# Patient Record
Sex: Female | Born: 1935 | Race: White | Hispanic: No | Smoking: Never smoker
Health system: Southern US, Community
[De-identification: ages and names within clinical notes are randomized; demographics above are authoritative.]

---

## 1999-02-21 ENCOUNTER — Encounter: Payer: Self-pay | Admitting: Emergency Medicine

## 1999-02-21 ENCOUNTER — Emergency Department (HOSPITAL_COMMUNITY): Admission: EM | Admit: 1999-02-21 | Discharge: 1999-02-21 | Payer: Self-pay | Admitting: Emergency Medicine

## 2003-08-01 ENCOUNTER — Encounter: Payer: Self-pay | Admitting: Internal Medicine

## 2003-08-01 ENCOUNTER — Encounter: Admission: RE | Admit: 2003-08-01 | Discharge: 2003-08-01 | Payer: Self-pay | Admitting: Internal Medicine

## 2003-09-25 ENCOUNTER — Encounter
Admission: RE | Admit: 2003-09-25 | Discharge: 2003-09-25 | Payer: Self-pay | Admitting: Physical Medicine and Rehabilitation

## 2004-06-07 ENCOUNTER — Inpatient Hospital Stay (HOSPITAL_COMMUNITY): Admission: RE | Admit: 2004-06-07 | Discharge: 2004-06-08 | Payer: Self-pay | Admitting: Neurosurgery

## 2005-04-14 ENCOUNTER — Inpatient Hospital Stay (HOSPITAL_COMMUNITY): Admission: EM | Admit: 2005-04-14 | Discharge: 2005-04-22 | Payer: Self-pay | Admitting: Emergency Medicine

## 2005-04-18 ENCOUNTER — Encounter (INDEPENDENT_AMBULATORY_CARE_PROVIDER_SITE_OTHER): Payer: Self-pay | Admitting: *Deleted

## 2010-03-29 ENCOUNTER — Encounter: Admission: RE | Admit: 2010-03-29 | Discharge: 2010-04-13 | Payer: Self-pay | Admitting: Internal Medicine

## 2011-04-08 NOTE — Discharge Summary (Signed)
NAMEKINISHA, SOPER NO.:  [id number]   MEDICAL RECORD NO.:  [id number]          PATIENT TYPE:  INP   LOCATION:  5711                         FACILITY:  [facility]   PHYSICIAN:  Gita Kudo, M.D. DATE OF BIRTH:  [birth year]   DATE OF ADMISSION:  04/13/2005  DATE OF DISCHARGE:  04/22/2005                                 DISCHARGE SUMMARY   DISCHARGE DIAGNOSES:  1.  Gallstone pancreatitis status post laparoscopic cholecystectomy with      intraoperative cholangiogram on Apr 18, 2005, by Dr. Avel Peace.  2.  Hypertension, treated.  3.  Hypothyroidism, treated.  4.  Arthritis.  5.  Systolic murmur.  6.  History of back surgery in July 2005.  7.  Status post tonsillectomy in the past.  8.  Allergy to SULFA.   Ms. Haughton is an 77 year old female who complains of six-week history of  nausea accompanied by poor appetite and fatigue.  These symptoms worsened  and gradually became accompanied by fever.  She presented to the emergency  room and gallbladder ultrasound showed a cholelithiasis with gallbladder  wall thickening as well as the normal common bile duct.  Her amylase and  lipase were elevated and her white count was 35,000.  She was admitted with  biliary pancreatitis.  Ultimately, her amylase and lipase normalized and by  Apr 18, 2005, she was taken to the operating room by Dr. Avel Peace.  She underwent a laparoscopic cholecystectomy with intraoperative  cholangiogram.  Patient tolerated the procedure well.  Over the next three  days, her activity was increased and her diet was advanced.  She was  discharged to home in stable condition.  Her JP drain was pulled prior to  her discharge.   Please note that during the patient's hospitalization, she did undergo ERCP  with sphincterotomy.  The common bile duct showed no obvious stones.  A  small sphincterotomy was done with good drainage.  This was performed prior  to her laparoscopic  cholecystectomy.   The patient was discharged home in stable condition.  She was to continue  her Synthroid, hydrochlorothiazide and aspirin daily.  She is to use Tylenol  as needed for pain.  She is not to lift over 10 pounds.  She is to drink  lots of fluids.  She may shower.  She is to follow up with Dr. Abbey Chatters in  two to three weeks and she is to call to make this appointment.        LB/MEDQ  D:  06/23/2005  T:  06/24/2005  Job:  [id number]   cc:   Theressa Millard, M.D.  [address] [place]  [place] [address]  Fax: [phone]

## 2011-04-08 NOTE — Discharge Summary (Signed)
[name] Edwards, [name] Edwards                 ACCOUNT NO.:  [id number]   MEDICAL RECORD NO.:  [id number]          PATIENT TYPE:  INP   LOCATION:  5711                         FACILITY:  [facility]   PHYSICIAN:  Adolph Pollack, M.D.DATE OF BIRTH:  Jan 21, 1916   DATE OF ADMISSION:  04/13/2005  DATE OF DISCHARGE:  04/22/2005                                 DISCHARGE SUMMARY   PRINCIPAL DISCHARGE DIAGNOSIS:  Biliary pancreatitis.   SECONDARY DIAGNOSES:  1.  Hypertension.  2.  Hypothyroidism.  3.  Arthritis.   OPERATION:  Laparoscopic cholecystectomy with intraoperative cholangiogram,  Apr 18, 2005.   REASON FOR ADMISSION:  [name] is an 77 year old female, admitted by Dr.  Merlene Laughter, Apr 13, 2005, when she had been having some abdominal pain  and nausea and vomiting with a fever.  She is brought to the emergency  department.  He evaluated her and noted that she had findings consistent  with biliary pancreatitis and admitted her (for more details please refer to  his admission history and physical).   HOSPITAL COURSE:  She is admitted, placed on empiric antibiotics, bowel  rest, and hydrated.  Surgical consultation was obtained, Apr 14, 2005.  She  had a persistent elevation of her bilirubin and a GI consultation was  obtained, and she was taken for an ERCP, where a periampullary diverticulum  was noted but no obvious common bile duct stones.  She recovered from that  quite well and then underwent a laparoscopic cholecystectomy, Apr 18, 2005,  which she tolerated fairly well.  She had a slow recovery following that.  A  19 Blake  drain was left.  By her third postoperative day, however, she was  more comfortable.  She was out of bed.  She was eating better.  Her drain  was removed and she was discharged.   DISPOSITION:  Discharged to home in satisfactory condition on April 22, 2005.  She was given discharge instructions.   PLAN:  Will be for her to call for an appointment to follow up in  the office  in 2-3 weeks.      Adolph Pollack, M.D.  Electronically Signed     TJR/MEDQ  D:  07/06/2005  T:  07/06/2005  Job:  [id number]   cc:   Theressa Millard, M.D.  [address] [place]  [place] [address]  Fax: [phone]   Graylin Shiver, M.D.  [address]  [address]  [place], [place] [address]  Fax: [phone]

## 2011-04-08 NOTE — Consult Note (Signed)
[name] Edwards, [name] Edwards                 ACCOUNT NO.:  [id number]   MEDICAL RECORD NO.:  [id number]          PATIENT TYPE:  INP   LOCATION:  5152                         FACILITY:  [facility]   PHYSICIAN:  Adolph Pollack, M.D.DATE OF BIRTH:  1916/09/27   DATE OF CONSULTATION:  04/14/2005  DATE OF DISCHARGE:                                   CONSULTATION   REQUESTING PHYSICIAN:  Theressa Millard, M.D.   REASON FOR CONSULTATION:  Biliary pancreatitis.   HISTORY OF PRESENT ILLNESS:  [name] Edwards is an 77 year old female who has had  about a six-week history of just having a poor appetite, having nausea and  fatigue.  This subsequently worsened the day of admission, and she is noted  to have fever.  She came to the emergency department and was evaluated.  At  that time the evaluation included an abdominal ultrasound demonstrating some  cholelithiasis, a thickened gallbladder wall, what appeared to be a normal-  sized common bile duct.  However, her liver functions were elevated, white  cell count was approximately 35,000, and amylase and lipase were elevated  consistent with biliary pancreatitis.  She subsequently was admitted to the  hospital, started on IV antibiotics and hydrated.  She has a very mild  amount of abdominal pain on palpation.  She is feeling a little stronger  now.   PAST MEDICAL HISTORY:  1. Hypertension.  2. Arthritis.  3. Hypothyroidism.  4. Lumbar spine disease.     PREVIOUS OPERATIONS:  1. Tonsillectomy.  2. Lower back surgery by Dr. Wynetta Emery.     ALLERGIES:  SULFA.   HOME MEDICATIONS:  Hydrochlorothiazide, Synthroid, baby aspirin, Aleve.   SOCIAL HISTORY:  She is widowed, lives in [place].  Denies tobacco or  alcohol use.   FAMILY HISTORY:  Positive for coronary artery disease.   REVIEW OF SYSTEMS:  Please see our attached sheet, but most noticeable for  some thyroid disease in the past and weakness, which has improved.  She has  also had persistent poor  appetite.   PHYSICAL EXAMINATION:  GENERAL:  A well-developed, well-nourished female who  appears younger than her stated age of 22.  HEENT:  She is afebrile with a pulse of 100, blood pressure 110/60,  respiratory rate is 20.  Saturations on room air are 97%.  HEENT:  Eyes:  Extraocular motions intact.  She does have scleral icterus  present.  NECK:  Supple without obvious masses, no bruits.  CARDIAC:  Regular rate and rhythm with a soft systolic murmur present.  LUNGS:  Equal breath sounds, clear to auscultation.  No labored breathing.  SKIN:  Skin is jaundiced.  ABDOMEN:  Soft.  There is tenderness along the upper abdomen to palpation;  however, she has no guarding or peritoneal signs.  No palpable masses.  Difficult to appreciate any hepatomegaly.  Hypoactive bowel sounds are  noted.  NEUROLOGIC:  She is alert and oriented x3 and has no gross motor deficits.  MUSCULOSKELETAL:  She actually has fairly good muscle tone and range of  motion.   LABORATORY DATA:  Admitting white cell  count was 35,600 with a hemoglobin of  12.  Potassium 3.2.  Total bilirubin 4.3, AST 125, ALT 103, alkaline  phosphatase 276.  Her lipase was 688 on admission.   IMPRESSION:  1. Biliary pancreatitis with jaundice suspicious for obstructive process.  2. Likely acute cholecystitis.  3. Despite normal common bile duct diameter, may have some transient or      persistent choledocholithiasis.     PLAN:  I agree with the antibiotics and bowel rest and hydration.  Will  repeat the liver function tests, lipase in the morning.  If liver function  tests continue to rise, I would suggest GI consultation to consider possible  ERCP for partially obstructing bile duct stone.  If she improves and regains  strength, we discussed the performance of a  laparoscopic cholecystectomy.  I did discuss the procedure rationale and  risks with her.  Risks include but are not limited to bleeding, infection,  wound healing  problems, anesthesia, injury to the bile  duct/liver/intestines, bile leak.  She seems to understand all this and is  agreeable with the plan.      TJR/MEDQ  D:  04/14/2005  T:  04/15/2005  Job:  [id number]   cc:   Theressa Millard, M.D.  [address] [place]  [place] [address]  Fax: [phone]

## 2011-04-08 NOTE — Consult Note (Signed)
[name], [name] NO.:  [id number]   MEDICAL RECORD NO.:  [id number]          PATIENT TYPE:  INP   LOCATION:  5711                         FACILITY:  [facility]   PHYSICIAN:  Graylin Shiver, M.D.   DATE OF BIRTH:  1916/07/04   DATE OF CONSULTATION:  04/15/2005  DATE OF DISCHARGE:                                   CONSULTATION   REFERRING PHYSICIAN:  Theressa Millard, M.D.   REASON FOR CONSULTATION:  This patient is an 77 year old female who was  admitted to the hospital with complaints of 6-week history of nausea, poor  appetite and fatigue, and abdominal discomfort.  She denied any nausea or  vomiting.  Her abdominal pain is located in the upper abdomen.  An abdominal  ultrasound was done showing multiple gallstones and a gallbladder wall which  was mildly thickened.  Her amylase on admission was elevated at 418, lipase  688.  The lipase has now dropped to 90.  The AST on admission was 125; it is  now 53.  The ALT 103; it is now 65.  Her total bilirubin was also elevated  on admission at 4.3, it is the same at this time, and the alkaline  phosphatase on admission was 276; it has risen somewhat to 286.  The concern  at this time is that there could be a common bile duct stone   PAST HISTORY:   ALLERGIES:  SULFA.  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   PAST MEDICAL HISTORY:  1.  Hypothyroidism.  2.  Arthritis.  3.  Hypertension.   SURGERIES:  1.  Back surgery.  2.  Tonsillectomy.   MEDICATIONS PRIOR TO ADMISSION:  Hydrochlorothiazide, Titralac, Synthroid,  baby aspirin, Aleve.   SOCIAL HISTORY:  Does not smoke or drink alcohol.   REVIEW OF SYSTEMS:  The patient was experiencing some fevers prior to  admission, denied any anginal chest pain, shortness of breath, cough or  sputum production.   PHYSICAL EXAMINATION:  GENERAL:  Does not appear in any acute distress at  this time.   She does not appear jaundiced clinically.  NECK:  Supple.  HEART:  Regular rhythm.  No murmurs.  LUNGS:  Clear.  ABDOMEN:  Bowel sounds are present.  It is soft.  There is tenderness in the  epigastric area and right upper quadrant.   IMPRESSION:  1.  Gallstone pancreatitis.  2.  Rule out common bile duct stone.   PLAN:  I discussed the options with the patient and her son, the options  included ERCP with possible sphincterotomy versus MRCP for further  diagnostic testing.  After considering both options, the patient's son and I  have elected to proceed with ERCP.  The procedure of ERCP with  sphincterotomy was explained along with potential risks of bleeding,  infection, perforation and pancreatitis.  They understand and consent to the  procedure.  I think that with her persistent elevation of bilirubin and  alkaline phosphatase, it is quite probable that  she has choledocholithiasis.      SFG/MEDQ  D:  04/15/2005  T:  04/16/2005  Job:  [id number]   cc:   Theressa Millard, M.D.  [address] [place]  [place] [address]  Fax: [phone]   Adolph Pollack, M.D.  [address], [address]  [place]  [place] [address]

## 2011-04-08 NOTE — H&P (Signed)
NAMEBUFFEY, ZABINSKI                 ACCOUNT NO.:  [id number]   MEDICAL RECORD NO.:  [id number]          PATIENT TYPE:  INP   LOCATION:  1827                         FACILITY:  [facility]   PHYSICIAN:  Hal T. Stoneking, M.D. DATE OF BIRTH:  [birth year]   DATE OF ADMISSION:  04/13/2005  DATE OF DISCHARGE:                                HISTORY & PHYSICAL   IDENTIFYING DATA:  [name] Edwards is a delightful, amazingly independent 77-  year-old white female.  She states she has been feeling poorly off and on  really for four to six months, with periodic nausea but no abdominal pain  until recently.  She states that she has manipulated her diet somewhat,  leaving off cereals in the morning, as she just did not feel like she could  eat them.  She saw Dr. Earl Gala in the office  possibly a month ago.  Did  evaluation, including lab work, then saw a Publishing rights manager in the office  two days ago.  Was given AcipHex, and yesterday morning developed multiple  episodes of nausea and vomiting, and then latera last evening a fever of  101, and was brought to the emergency room for further evaluation.  On  questioning, she has had periodic pain in the right abdominal area.  She has  had no cough, no headache.   She is allergic to SULFA.   CURRENT MEDICATIONS:  1.  Synthroid 0.075 mg daily.  2.  Hydrochlorothiazide 25 mg daily.  3.  Just started on AcipHex.  4.  She is on multiple vitamin daily.  5.  Titralac p.r.n.  6.  Aspirin 81 mg daily.   PAST MEDICAL HISTORY:  1.  Hypothyroidism.  2.  DJD, especially of the lumbosacral spine.  3.  Basal cell carcinoma of the face.  4.  Hypertension.  5.  Elevated cholesterol.   PREVIOUS SURGERY:  1.  Tonsillectomy, adenoidectomy.  2.  Decompressive laminectomy L3-L4 on the right, diskectomy at L3-L4, and      decompressive laminectomy L4-L5, with microscopic foraminotomies at L3,      L4, L5 nerve roots by Dr. Donalee Citrin in July 2005.   FAMILY HISTORY:   Mother died at age 52 of hypertension and renal failure.  Father died at age 73 from Alzheimer's disease.  Sister died at age 41 from  pulmonary fibrosis.  Brother in his 55s had Alzheimer's disease, also an MI.  Has one son and daughter.  She is a Futures trader.   SOCIAL HISTORY:  She is independent, lives alone.  She is widowed.  Has a  son and daughter.  Does not smoke or drink.  Was a homemaker.   REVIEW OF SYSTEMS:  No headache, no chest pain, no significant cough.  GI:  Please see HPI.   PHYSICAL EXAMINATION:  VITAL SIGNS:  Temperature 98.7; blood pressure  initially 80 systolic, but 100/58 after fluid challenge; O2 saturation 95%;  respiratory rate 18.  SKIN:  Warm and dry.  HEENT:  Pupils equal, round, and reactive to light.  Disks sharp.  TMs  normal.  Oropharynx moist.  NECK:  No JVD or thyromegaly.  LUNGS:  Clear.  HEART:  Regular rate and rhythm, without murmurs, rubs, or gallops.  ABDOMEN:  Obese.  Bowel sounds present.  She does have tenderness in the  right upper quadrant area with palpation.  NEUROLOGIC:  She is alert, oriented x 3.   LABORATORY DATA:  White count 35,600, hemoglobin 12, platelet count 349,000,  neutrophils 95%, lymphs 2%, monos 3%.  Sodium 133, potassium 3.2, chloride  99, bicarbonate 23, glucose 118, BUN 16, creatinine 1.2, calcium 8.3,  albumin 2.7, SGOT 125, SGPT 103, alkaline phosphatase 276, total bilirubin  4.3.  Lipase elevated at 688.   ASSESSMENT:  Chronic nausea and now with jaundice, elevated LFTs and  amylase.  Suspect a gallstone pancreatitis.  However, need to rule out other  obstructive biliary process.  Associated with hypotension due to dehydration  and sepsis.   PLAN:  Will draw blood cultures, PT, PTT, and amylase.  Will resuscitated  with IV fluids, begin IV Unasyn, and await ultrasound results.      HTS/MEDQ  D:  04/14/2005  T:  04/14/2005  Job:  [id number]

## 2011-04-08 NOTE — Op Note (Signed)
[name], [name]                 ACCOUNT NO.:  [id number]   MEDICAL RECORD NO.:  [id number]          PATIENT TYPE:  INP   LOCATION:  5711                         FACILITY:  [facility]   PHYSICIAN:  Graylin Shiver, M.D.   DATE OF BIRTH:  [birth year]   DATE OF PROCEDURE:  04/16/2005  DATE OF DISCHARGE:                                 OPERATIVE REPORT   PROCEDURE:  Endoscopic retrograde cholangiogram with sphincterotomy.   INDICATIONS FOR PROCEDURE:  Gallstone pancreatitis.   Informed consent was obtained after explanation of the risks of bleeding,  infection, perforation and pancreatitis.   PREMEDICATION:  Fentanyl 70 mcg IV, Versed 7 mg IV.   DESCRIPTION OF PROCEDURE:  With the patient in the left lateral position,  the Olympus lateral viewing duodenoscope was inserted into the oropharynx  and passed into the esophagus. It was advanced down the esophagus then into  the stomach and into the duodenum. The patient was placed more on her  abdomen.  The papilla of Vater was located inside a large diverticulum in  the duodenum.  After some manipulation of the scope, the common bile duct  was able to be selectively cannulated with a guidewire and papillotome.  Contrast was injected into the common bile duct and common hepatic duct.  There were no obvious stones seen in the common bile duct or common hepatic  duct.  a small sphincterotomy was done with good drainage of bile noted to  flow out after the sphincterotomy.  The gallbladder did not fill.  The  pancreatic duct was not cannulated.  She tolerated the procedure well  without complications.   IMPRESSION:  Gallstone pancreatitis. I suspect that this patient passed a  stone.  The common duct appears clear at this time.   I feel that she can go ahead and have her laparoscopic cholecystectomy when  Dr. Abbey Chatters is ready to proceed.      SFG/MEDQ  D:  04/17/2005  T:  04/17/2005  Job:  [id number]   cc:   Adolph Pollack, M.D.  [address]  [place]  [place] [address]   Theressa Millard, M.D.  [address] [place]  [place] [address]  Fax: [phone]   Candyce Churn, M.D.  [address] [place]  [place] [address]  Fax: [phone]

## 2011-04-08 NOTE — Op Note (Signed)
NAME:  [name] Edwards, [name] Edwards                           ACCOUNT NO.:  [id number]   MEDICAL RECORD NO.:  [id number]                   PATIENT TYPE:  INP   LOCATION:  2899                                 FACILITY:  [facility]   PHYSICIAN:  Donalee Citrin, M.D.                     DATE OF BIRTH:  [birth year]   DATE OF PROCEDURE:  06/07/2004  DATE OF DISCHARGE:                                 OPERATIVE REPORT   PREOPERATIVE DIAGNOSIS:  Severe spinal stenosis L3-4, L4-5 with degenerative  spondylolisthesis, L4-5 and right side L3, 4 and 5 radiculopathies.   POSTOPERATIVE DIAGNOSIS:  Severe spinal stenosis L3-4, L4-5 with  degenerative spondylolisthesis, L4-5 and right side L3, 4 and 5  radiculopathies.   OPERATION PERFORMED:  Decompressive laminectomy L3-4 unilaterally on the  right with a right-sided diskectomy at L3-4 and decompressive laminectomy at  L4-5 with microscopic foraminotomies of the L3, L4 and L5 nerve roots.   SURGEON:  Donalee Citrin, M.D.   ASSISTANT:  Kathaleen Maser. Pool, M.D.   ANESTHESIA:  General endotracheal.   INDICATIONS FOR PROCEDURE:  The patient is a very pleasant 77 year old  female who has a longstanding back and right leg pain radiating down through  her hip, front of her shin and occasionally the top of her foot and big toe.  The patient has been refractory to all forms of conservative treatment with  anti-inflammatories, physical therapy, epidural  steroid injections.  Progressively worse, progressively more debilitated.  Preoperative imaging  showed severe spinal stenosis from a ruptured disk and free fragment L3-4 as  well as degenerative spondylolisthesis and facet arthropathy and lateral  recess stenosis L4-5.  Due to the patient failure of conservative treatment,  progressive debility, the patient was recommended decompressive  laminectomies with possible diskectomy with decompression of the L3, 4 and 5  nerve roots.  The risks and benefits were discussed extensively with  patient  and the patient states she understands and agreed to proceed forward.   DESCRIPTION OF PROCEDURE:  The patient was brought to the operating room and  placed supine on the operating table, induced under general anesthesia,  placed prone on a Wilson frame.  Back was prepped in the usual sterile  fashion.  Preoperative x-ray localized the L3-4 disk space.  A midline  incision was made after infiltration with 10 mL of lidocaine with  epinephrine.  Bovie electrocautery was used to take down subcutaneous  tissues, subperiosteal dissection carried out to lamina of L3, 4 and 5 on  the right side.  A self-retaining retractor was placed.  Then intraoperative  x-ray confirmed localization of probe underneath the L3 lamina just below  the L3-4 disk space.  Then using a high speed drill the inferior aspect of  the lamina of  L3, medial aspect facet complex of L3-4 as well as  decompression of lamina at L4 and medial aspect facet  complex L4-5 were  drilled down using a 3 mm Kerrison punch first at L3-4.  The large  hypertrophied ligament and inferior aspect of lamina was removed exposing  the ligamentum flavum which was teased away with a 4 Penfield as well as an  angled nerve hook and this was all underbitten exposing the thecal sac.  There was noted to be a tremendous amount of facet arthropathy and ligament  hypertrophy contributing to lateral recess stenosis underneath the L3-4  facet complex.  This was all teased away with a 4 Penfield.  Then after the  dura was identified, it was packed away.  Attention was then turned to gross  dissection of the L4-5 disk space.  Again 3 mm Kerrison punches were used to  removed the inferior aspect of lamina of L4, medial aspect of facet complex  was underbitten decompressing the thecal sac and large severely  hypertrophied ligamentum flavum was identified and teased away from the  dura.  This was underbitten with 3 and 4 mm Kerrison punches.  Then the   operating microscope was draped and brought into the field and under  microscopic illumination first at the L3-4 disk space, the L4 nerve root was  identified and the remainder of the underfacet complex was underbitten  taking care to maintain most of the integrity of the facet.  The L4 nerve  root was identified and with a 4 Penfield it was teased off of a large  ruptured disk fragment at the disk space that had migrated cephalad.  A  D'Errico nerve root retractor was used to reflect the right L4 nerve root  medially.  The annulotomy was entered with an 11 blade scalpel. Pituitary  rongeurs were used to clean up the disk space as well as blunt nerve hook  was used to tease away the free fragment that had migrated cephalad behind  the L3 vertebral body.  After this diskectomy, the L4 nerve root was  completely decompressed, then the lateral recess was opened up to L3-4.  Then this was packed off with Gelfoam. Attention was then turned to 4-5. The  4-5 stenosis was predominantly facet arthropathy and lateral recess stenosis  with kind of an osteophytic complex coming off the 4-5 disk space due to the  spondylolisthesis at this level and the pathology being predominantly  lateral recess stenosis, it was decided not to enter the disk space at this  level and the L5 nerve root was identified adequately decompressed out its  neural foramen.  It was explored with a coronary dilator and a hockey stick  and noted to have no further stenosis.  The L4 nerve root was also palpated  distally and laterally in its neural foramen from this location and noted to  have no further stenosis.  The central canal was widely decompressed as well  as the L3, 4 and 5 nerve roots and the L3 nerve root was also palpated out  initially ___________  lateral disk at L3-4 was teased away decompressing  this nerve root.  At the end of the decompression, all three neural foramina were widely patent.  The wound was copiously  irrigated, meticulous  hemostasis was maintained. Gelfoam was overlaid on top of the dura.  The  muscle and fascia were reapproximated with 0 interrupted Vicryl,  subcutaneous tissue with 2-0 interrupted Vicryl.  Skin was closed with  running 4-0 subcuticular and Steri-strips applied.  Patient to recovery room  in stable condition.  Donalee Citrin, M.D.    GC/MEDQ  D:  06/07/2004  T:  06/07/2004  Job:  [id number]

## 2011-04-08 NOTE — Op Note (Signed)
[name] Edwards, ROEPER                 ACCOUNT NO.:  [id number]   MEDICAL RECORD NO.:  [id number]          PATIENT TYPE:  INP   LOCATION:  5711                         FACILITY:  [facility]   PHYSICIAN:  Adolph Pollack, M.D.DATE OF BIRTH:  1916-01-10   DATE OF PROCEDURE:  04/18/2005  DATE OF DISCHARGE:                                 OPERATIVE REPORT   PREOPERATIVE DIAGNOSIS:  Biliary pancreatitis.   POSTOPERATIVE DIAGNOSES:  1.  Biliary pancreatitis.  2.  Chronic cholecystitis.   PROCEDURES:  Laparoscopic cholecystectomy with intraoperative cholangiogram.   SURGEON:  Adolph Pollack, M.D.   ANESTHESIA:  General.   INDICATION:  Ms. Grime is a 77-year-old female who was admitted to the  hospital on Apr 13, 2005, and found to have biliary pancreatitis. She had  persistent elevation of her total bilirubin and phosphatase. She underwent  an ERCP a couple days ago which she tolerated fairly well and was able to  have a diet yesterday. She had decline of her liver function tests and  lipase and this is felt to be satisfactory for laparoscopic cholecystectomy  today. We previously discussed the procedure and the risks.   TECHNIQUE:  She was seen in the holding area and then brought to the  operating room and placed on the operating table. General anesthetic was  administered. Her abdominal wall was sterilely prepped and draped. A small  subumbilical incision was made through the skin, subcutaneous tissue, fascia  and peritoneum, entering the peritoneal cavity under direct vision. A  pursestring suture of 0 Vicryl was placed around the fascial edges. A Hassan  trocar was introduced into the peritoneal cavity and a pneumoperitoneum was  created by insufflation of CO2 gas.   Next an 11 mm incision was made in the epigastrium and an 11 mm trocar  inserted through this incision into the peritoneal cavity. Two 5 mm trocars  were inserted the right mid abdomen.   She was placed in the  reverse Trendelenburg position, right side tilted up.  Under laparoscopic vision, the fundus was grasped.  There were omental  adhesions to the fundus and the body, which were taken down bluntly and with  cautery. The gallbladder was fairly firm with chronic inflammatory change. I  used blunt dissection and select cautery to further identify the  infundibulum of the gallbladder. There appeared to be a fair amount of  chronic inflammatory change as well around the cystic duct region. I was  able to identify the common bile duct. I dissected around the infundibulum,  making a window around it. I subsequently made a small incision in the  infundibulum just above the neck of the gallbladder. I passed a  cholangiocatheter through the abdominal wall and placed it into the  infundibulum. Because of the inflammatory change, I was unable to  definitively identify the cystic duct at this time. I then performed a  cholangiogram.   On real time fluoroscopy, multiple runs were performed of cholangiography.  In the first runs I was having some trouble completely identifying the  cystic duct, although the common bile duct,  hepatic ducts opacified and the  common duct emptied to the duodenum fairly well with what appeared to be a  duodenal diverticulum. I subsequently used full-strength contrast and could  identify a small cystic duct. It was patent. I did not see any evidence of  common bile duct stones and she had had a preoperative ERCP with clearance  of this.   Following this, I removed the cholangiocatheter. I had a nice window around  this area. I went ahead and took an Endo-GIA stapler and clamped on the  gallbladder neck and divided it there, leaving the small bit of gallbladder  with the cystic duct. This also divided and occluded the cystic artery which  was lying directly anterior to the cystic duct.   Following this, I inspected the staple line. One corner was bleeding and I  applied a  clip to it. I did not see a bile leak or further bleeding after  this. The gallbladder was then dissected from the liver bed intact with  electrocautery. Bleeding points in the liver bed were controlled with  electrocautery and then Surgicel was placed. Irrigation was performed. Blood  clots and fluid were removed. The area was inspected again and was  hemostatic with no evidence of bile leakage. Because of inflammatory change  and using the stapler on the infundibulum of the gallbladder, I decided to  leave a drain in the gallbladder fossa. This was placed into the abdominal  cavity, placed in the gallbladder fossa and it was pulled out the lateral 5  mm trocar site and anchored to the skin with nylon suture. Following this, I  removed the gallbladder in the Endopouch bag and the Endopouch bag through  the subumbilical port. I then closed the fascial defect by tightening up and  tying down the pursestring suture and adding one simple suture as well. This  was done under laparoscopic vision. The remaining trocars were removed and  the pneumoperitoneum was released. The skin incisions were closed with 4-0  Monocryl subcuticular stitches followed by Steri-Strips and sterile  dressings. She tolerated the procedure without any apparent complications  and was taken to recovery room in satisfactory condition.      TJR/MEDQ  D:  04/18/2005  T:  04/18/2005  Job:  [id number]   cc:   Theressa Millard, M.D.  [address] [place]  [place] [address]  Fax: [phone]   Graylin Shiver, M.D.  [address]  [address]  [place], [place] [address]  Fax: [phone]

## 2013-03-04 ENCOUNTER — Inpatient Hospital Stay (HOSPITAL_COMMUNITY)
Admission: AD | Admit: 2013-03-04 | Discharge: 2013-03-08 | DRG: 392 | Disposition: A | Discharge status: Skilled Nursing Facility | Payer: Medicare Other | Source: Ambulatory Visit | Attending: Internal Medicine | Admitting: Internal Medicine

## 2013-03-04 ENCOUNTER — Encounter (HOSPITAL_COMMUNITY): Payer: Self-pay | Admitting: *Deleted

## 2013-03-04 ENCOUNTER — Observation Stay (HOSPITAL_COMMUNITY): Payer: Medicare Other

## 2013-03-04 DIAGNOSIS — F0280 Dementia in other diseases classified elsewhere without behavioral disturbance: Secondary | ICD-10-CM | POA: Diagnosis present

## 2013-03-04 DIAGNOSIS — G309 Alzheimer's disease, unspecified: Secondary | ICD-10-CM | POA: Diagnosis present

## 2013-03-04 DIAGNOSIS — I129 Hypertensive chronic kidney disease with stage 1 through stage 4 chronic kidney disease, or unspecified chronic kidney disease: Secondary | ICD-10-CM | POA: Diagnosis present

## 2013-03-04 DIAGNOSIS — A0811 Acute gastroenteropathy due to Norwalk agent: Principal | ICD-10-CM | POA: Diagnosis present

## 2013-03-04 DIAGNOSIS — E860 Dehydration: Secondary | ICD-10-CM | POA: Diagnosis present

## 2013-03-04 DIAGNOSIS — N179 Acute kidney failure, unspecified: Secondary | ICD-10-CM | POA: Diagnosis present

## 2013-03-04 DIAGNOSIS — Z79899 Other long term (current) drug therapy: Secondary | ICD-10-CM

## 2013-03-04 DIAGNOSIS — R5381 Other malaise: Secondary | ICD-10-CM

## 2013-03-04 DIAGNOSIS — I10 Essential (primary) hypertension: Secondary | ICD-10-CM | POA: Diagnosis present

## 2013-03-04 DIAGNOSIS — R197 Diarrhea, unspecified: Secondary | ICD-10-CM | POA: Diagnosis present

## 2013-03-04 DIAGNOSIS — Z66 Do not resuscitate: Secondary | ICD-10-CM | POA: Diagnosis present

## 2013-03-04 DIAGNOSIS — N189 Chronic kidney disease, unspecified: Secondary | ICD-10-CM | POA: Diagnosis present

## 2013-03-04 DIAGNOSIS — R112 Nausea with vomiting, unspecified: Secondary | ICD-10-CM

## 2013-03-04 DIAGNOSIS — R531 Weakness: Secondary | ICD-10-CM | POA: Diagnosis present

## 2013-03-04 DIAGNOSIS — E785 Hyperlipidemia, unspecified: Secondary | ICD-10-CM | POA: Diagnosis present

## 2013-03-04 DIAGNOSIS — E039 Hypothyroidism, unspecified: Secondary | ICD-10-CM | POA: Diagnosis present

## 2013-03-04 DIAGNOSIS — Z7982 Long term (current) use of aspirin: Secondary | ICD-10-CM

## 2013-03-04 DIAGNOSIS — N183 Chronic kidney disease, stage 3 unspecified: Secondary | ICD-10-CM | POA: Diagnosis present

## 2013-03-04 LAB — CBC
HCT: 42 % (ref 36.0–46.0)
Hemoglobin: 14.6 g/dL (ref 12.0–15.0)
MCHC: 34.8 g/dL (ref 30.0–36.0)
MCV: 92.1 fL (ref 78.0–100.0)
RDW: 14.5 % (ref 11.5–15.5)

## 2013-03-04 MED ORDER — ONDANSETRON HCL 4 MG PO TABS: 4.0000 mg | ORAL_TABLET | Freq: Four times a day (QID) | ORAL | Status: DC | PRN

## 2013-03-04 MED ORDER — MEMANTINE HCL 5 MG PO TABS
5.0000 mg | ORAL_TABLET | Freq: Two times a day (BID) | ORAL | Status: DC
  Administered 2013-03-04 – 2013-03-08 (×8): 5 mg via ORAL
  Filled 2013-03-04 (×10): qty 1

## 2013-03-04 MED ORDER — ASPIRIN 81 MG PO CHEW
81.0000 mg | CHEWABLE_TABLET | Freq: Every day | ORAL | Status: DC
  Administered 2013-03-04 – 2013-03-08 (×5): 81 mg via ORAL
  Filled 2013-03-04 (×5): qty 1

## 2013-03-04 MED ORDER — RIVASTIGMINE 4.6 MG/24HR TD PT24
4.6000 mg | MEDICATED_PATCH | Freq: Every day | TRANSDERMAL | Status: DC
  Administered 2013-03-05 – 2013-03-08 (×4): 4.6 mg via TRANSDERMAL
  Filled 2013-03-04 (×6): qty 1

## 2013-03-04 MED ORDER — ACETAMINOPHEN 325 MG PO TABS: 650.0000 mg | ORAL_TABLET | Freq: Four times a day (QID) | ORAL | Status: DC | PRN

## 2013-03-04 MED ORDER — PREGABALIN 50 MG PO CAPS
50.0000 mg | ORAL_CAPSULE | Freq: Every day | ORAL | Status: DC
  Administered 2013-03-04 – 2013-03-08 (×5): 50 mg via ORAL
  Filled 2013-03-04 (×5): qty 1

## 2013-03-04 MED ORDER — LEVOTHYROXINE SODIUM 75 MCG PO TABS
75.0000 ug | ORAL_TABLET | Freq: Every day | ORAL | Status: DC
  Administered 2013-03-05 – 2013-03-08 (×4): 75 ug via ORAL
  Filled 2013-03-04 (×4): qty 1

## 2013-03-04 MED ORDER — ACETAMINOPHEN 650 MG RE SUPP: 650.0000 mg | Freq: Four times a day (QID) | RECTAL | Status: DC | PRN

## 2013-03-04 MED ORDER — VITAMIN C 100 MG PO TABS: 100.0000 mg | ORAL_TABLET | Freq: Every day | ORAL | Status: DC

## 2013-03-04 MED ORDER — SODIUM CHLORIDE 0.9 % IV SOLN: INTRAVENOUS | Status: DC

## 2013-03-04 MED ORDER — HEPARIN SODIUM (PORCINE) 5000 UNIT/ML IJ SOLN
5000.0000 [IU] | Freq: Three times a day (TID) | INTRAMUSCULAR | Status: DC
  Administered 2013-03-04 – 2013-03-08 (×12): 5000 [IU] via SUBCUTANEOUS
  Filled 2013-03-04 (×15): qty 1

## 2013-03-04 MED ORDER — VITAMIN C 250 MG PO TABS
125.0000 mg | ORAL_TABLET | Freq: Every day | ORAL | Status: DC
  Administered 2013-03-05 – 2013-03-08 (×4): 125 mg via ORAL
  Filled 2013-03-04 (×5): qty 1

## 2013-03-04 MED ORDER — ONDANSETRON HCL 4 MG/2ML IJ SOLN: 4.0000 mg | Freq: Four times a day (QID) | INTRAMUSCULAR | Status: DC | PRN

## 2013-03-04 MED ORDER — VITAMIN C 100 MG PO TABS
100.0000 mg | ORAL_TABLET | Freq: Every day | ORAL | Status: DC
Start: 2013-03-04 — End: 2013-03-04

## 2013-03-04 MED ORDER — ADULT MULTIVITAMIN W/MINERALS CH
1.0000 | ORAL_TABLET | Freq: Every day | ORAL | Status: DC
  Administered 2013-03-04 – 2013-03-08 (×5): 1 via ORAL
  Filled 2013-03-04 (×5): qty 1

## 2013-03-04 NOTE — H&P (Signed)
[facility] History and Physical  ANGENI CHAUDHURI WUJ:[id number] DOB: [birth year] DOA: 03/04/2013  Referring physician: Dr. Valentina Lucks (direct admission)  PCP: Darnelle Bos, MD    Chief Complaint: N/V, diarrhea and weakness  HPI: [name] Edwards is a 79 y.o. female with PMH significant for CKD (stage 3), HTN, HLD and hypothyroidism; came tot he hospital from PCP office due to nausea, vomiting, diarrhea and weakness. Patient with approx 3 days of GI symptoms, leading to increased weakness and inability to keep anything down, including medications. Patient denies CP, SOB, abdominal pain, hematemesis, melena or any other complaints. At the PCP office found to be dehydrated on physical exam and with worsening Cr.  Review of Systems:  Negative except as mentioned on HPI.  History reviewed. No pertinent past medical history.  History reviewed. No pertinent past surgical history.  Social History:  reports that she has never smoked. She does not have any smokeless tobacco history on file. Her alcohol and drug histories are not on file. lives with daughter; widowed   Allergies  Allergen Reactions  . Sulfa Antibiotics     unknown    FMH: pertinent for HTN, ESRD, SDAT and pumonary fibrosis  Prior to Admission medications   Medication Sig Start Date End Date Taking? Authorizing Provider  Ascorbic Acid (VITAMIN C) 100 MG tablet Take 100 mg by mouth daily.   Yes Historical Provider, MD  aspirin 81 MG chewable tablet Chew 81 mg by mouth daily.   Yes Historical Provider, MD  calcium carbonate (TUMS - DOSED IN MG ELEMENTAL CALCIUM) 500 MG chewable tablet Chew 1 tablet by mouth daily.   Yes Historical Provider, MD  fish oil-omega-3 fatty acids 1000 MG capsule Take 1 g by mouth 2 (two) times daily.   Yes Historical Provider, MD  hydrochlorothiazide (HYDRODIURIL) 25 MG tablet Take 25 mg by mouth daily.   Yes Historical Provider, MD  levothyroxine (SYNTHROID, LEVOTHROID) 75 MCG tablet Take 75  mcg by mouth daily before breakfast.   Yes Historical Provider, MD  memantine (NAMENDA) 5 MG tablet Take 5 mg by mouth 2 (two) times daily.   Yes Historical Provider, MD  Multiple Vitamin (MULTIVITAMIN WITH MINERALS) TABS Take 1 tablet by mouth daily.   Yes Historical Provider, MD  Multiple Vitamins-Minerals (EYE VITAMINS PO) Take 1 tablet by mouth daily.   Yes Historical Provider, MD  naproxen sodium (ANAPROX) 220 MG tablet Take 220 mg by mouth every 12 (twelve) hours.   Yes Historical Provider, MD  pregabalin (LYRICA) 50 MG capsule Take 50 mg by mouth daily.   Yes Historical Provider, MD  rivastigmine (EXELON) 4.6 mg/24hr Place 1 patch onto the skin daily.   Yes Historical Provider, MD   Physical Exam: There were no vitals filed for this visit.   General: NAD, afebrile, AAOX2, no CP or SOB, cooperative to examination  Eyes: PERRL, EOMI, no icterus or nystagmus  ENT: dry MM, no erythema or exudates inside her mouth; denture in place  Neck: supple, no JVD, no bruits  Cardiovascular: mild tachycardia (HR 97); positive SEM, no rubs or gallops   Respiratory: CTA bilaterally  Abdomen: soft, NT, ND, positive BS  Skin: no rash or petechiae  Musculoskeletal: no joint swelling or erythema  Psychiatric: normal affect, no SI or hallucinations  Neurologic: no focal motor or sensory deficit, grossly intact CN, Ms 4/5 bilaterally and simetrically, AAOX2  Labs on Admission:  Ordered but pending by the of this note. PER PCP office results NA 143 Potassium 3.9  CO2 25 Cr 1.66 BUN 40  Assessment/Plan 1-Nausea with vomiting and diarrhea: most likely due to viral gastroenteritis vs bact gastroenteritis. -will check CBC -IVF's resuscitation and supportive care -check GI pathogen stool panel -replete electrolytes as needed -since she is not febrile, will hold on empiric abx's -PRN antiemetics -CLD with orders to advance as tolerated  2-Dehydration: due to decrease PO intake and GI  loses -IVF's -CLD and encourage PO intake once symptoms better  3-Acute on chronic renal failure: stage 3 at baseline. Pre-renal due to decrease PO intake, GI loses and continue use of HCTZ. -hold diuretics -check UA -IVF's -follow renal function  4-HTN (hypertension): stable currently. Will hold antihypertensive agents at this point; mainly diuretic and patient with acute on chronic renal failure. Will provide IVF's  5-Alzheimer's disease: continue namenda, exelon patch and supportive care  6-Unspecified hypothyroidism: will check TSH and continue synthroid  7-Weakness generalized: due to dehydration and anorexia; on top of debility and deconditioning from age. Will ask PT to evaluate  8-dyslipidemia: will check lipid panel and will continue fish oil  DVT: heparin  Code Status: DNR Family Communication: Son at bedside Disposition Plan: Admit for observation, med-surg bed; LOS < 2 midnights  Time spent: >30 minutes  Neldon Shepard [facility] Pager [phone]  If 7PM-7AM, please contact night-coverage [email or web address] Password [email or web address] 03/04/2013, 6:35 PM

## 2013-03-05 LAB — BASIC METABOLIC PANEL
BUN: 37 mg/dL — ABNORMAL HIGH (ref 6–23)
Calcium: 9 mg/dL (ref 8.4–10.5)
GFR calc Af Amer: 41 mL/min — ABNORMAL LOW (ref >90)
GFR calc non Af Amer: 35 mL/min — ABNORMAL LOW (ref >90)
Glucose, Bld: 90 mg/dL (ref 70–99)
Potassium: 3.2 mEq/L — ABNORMAL LOW (ref 3.5–5.1)

## 2013-03-05 LAB — URINALYSIS, ROUTINE W REFLEX MICROSCOPIC
Bilirubin Urine: NEGATIVE
Nitrite: NEGATIVE
Protein, ur: NEGATIVE mg/dL
Specific Gravity, Urine: 1.017 (ref 1.005–1.030)
Urobilinogen, UA: 0.2 mg/dL (ref 0.0–1.0)

## 2013-03-05 LAB — COMPREHENSIVE METABOLIC PANEL
Albumin: 3.8 g/dL (ref 3.5–5.2)
Alkaline Phosphatase: 77 U/L (ref 39–117)
BUN: 38 mg/dL — ABNORMAL HIGH (ref 6–23)
Creatinine, Ser: 1.28 mg/dL — ABNORMAL HIGH (ref 0.50–1.10)
GFR calc Af Amer: 40 mL/min — ABNORMAL LOW (ref >90)
Glucose, Bld: 100 mg/dL — ABNORMAL HIGH (ref 70–99)
Potassium: 3.5 mEq/L (ref 3.5–5.1)
Total Bilirubin: 0.5 mg/dL (ref 0.3–1.2)
Total Protein: 6.9 g/dL (ref 6.0–8.3)

## 2013-03-05 LAB — LIPID PANEL
Cholesterol: 180 mg/dL (ref 0–200)
Triglycerides: 117 mg/dL (ref <150)
VLDL: 23 mg/dL (ref 0–40)

## 2013-03-05 LAB — CBC
MCH: 31 pg (ref 26.0–34.0)
MCHC: 33.6 g/dL (ref 30.0–36.0)
Platelets: 238 10*3/uL (ref 150–400)
RDW: 14.5 % (ref 11.5–15.5)

## 2013-03-05 LAB — URINE MICROSCOPIC-ADD ON

## 2013-03-05 LAB — MAGNESIUM: Magnesium: 2.2 mg/dL (ref 1.5–2.5)

## 2013-03-05 LAB — TSH: TSH: 4.662 u[IU]/mL — ABNORMAL HIGH (ref 0.350–4.500)

## 2013-03-05 MED ORDER — SODIUM CHLORIDE 0.9 % IV SOLN
INTRAVENOUS | Status: AC
  Administered 2013-03-05: 20 mL/h via INTRAVENOUS

## 2013-03-05 NOTE — Progress Notes (Signed)
Assessment/Plan: Principal Problem:   Viral enteritis - this is better and she should be able to advance diet.  Active Problems:   Dehydration - improved.    Chronic kidney disease - back to baseline   HTN (hypertension) - BP up some today. HCTZ on hold while volume status improved.Will not start HCTZ yet   Alzheimer's disease   Unspecified hypothyroidism   Weakness generalized  Patient's son in the room. He now states that even prior to this illness, the patient's care was becoming too much for the patient's daughter with whom she lives. This is based on virtual blindness, dementia, difficulty ambulating (uses walker) , and falls in the past (although none recently). I told them that she is on observation status and, at least right now, does not have a dx that would cause admission. Therefore, there will be no [facility] benefit. Nevertheless, he states that he would like for [place] to investigate placement for her as he thinks this will be the longer term best thing for her. I told him I would get CSW involved. Will proceed with PT eval.    Subjective: She is feeling much better. No more N/V and no more diarrhea. Feels much stronger.   Objective:  Vital Signs: Filed Vitals:   03/04/13 1839 03/04/13 2113 03/05/13 0538  BP: 148/63 155/90 140/119  Pulse: 95 86 90  Temp: 97.3 F (36.3 C) 97.8 F (36.6 C) 97.4 F (36.3 C)  TempSrc: Oral Oral Oral  Resp: 15 18 18   Height: 5\' 2"  (1.575 m)    SpO2: 95% 100% 99%     EXAM: Awake, alert, comfortable  ABD: negative.   No intake or output data in the 24 hours ending 03/05/13 0725  Lab Results:  Recent Labs  03/04/13 2305  NA 144  K 3.5  CL 106  CO2 19  GLUCOSE 100*  BUN 38*  CREATININE 1.28*  CALCIUM 9.3  MG 2.2    Recent Labs  03/04/13 2305  AST 39*  ALT 26  ALKPHOS 77  BILITOT 0.5  PROT 6.9  ALBUMIN 3.8   No results found for this basename: LIPASE, AMYLASE,  in the last 72 hours  Recent Labs  03/04/13 2305  03/05/13 0600  WBC 9.6 10.7*  HGB 14.6 13.4  HCT 42.0 39.9  MCV 92.1 92.4  PLT 235 238   No results found for this basename: CKTOTAL, CKMB, CKMBINDEX, TROPONINI,  in the last 72 hours No components found with this basename: POCBNP,  No results found for this basename: DDIMER,  in the last 72 hours No results found for this basename: HGBA1C,  in the last 72 hours No results found for this basename: CHOL, HDL, LDLCALC, TRIG, CHOLHDL, LDLDIRECT,  in the last 72 hours No results found for this basename: TSH, T4TOTAL, FREET3, T3FREE, THYROIDAB,  in the last 72 hours No results found for this basename: VITAMINB12, FOLATE, FERRITIN, TIBC, IRON, RETICCTPCT,  in the last 72 hours  Studies/Results: Acute Abdominal Series  03/04/2013  *RADIOLOGY REPORT*  Clinical Data: Nausea and vomiting.  ACUTE ABDOMEN SERIES (ABDOMEN 2 VIEW & CHEST 1 VIEW)  Comparison: Chest radiograph performed 04/14/2005  Findings: The lungs are well-aerated.  Minimal bibasilar atelectasis is noted.  Mild chronic peribronchial thickening is seen.  There is no evidence of pleural effusion or pneumothorax. The cardiomediastinal silhouette is within normal limits.  The visualized bowel gas pattern is unremarkable.  Scattered stool and air are seen within the colon; there is no evidence of  small bowel dilatation to suggest obstruction.  No free intra-abdominal air is identified on the provided decubitus view.  Clips are noted within the right upper quadrant, reflecting prior cholecystectomy.  Degenerative change is noted at the lower lumbar spine; the sacroiliac joints are unremarkable in appearance.  Scattered vascular calcifications are seen.  IMPRESSION:  1.  Unremarkable bowel gas pattern; no free intra-abdominal air seen. 2.  Minimal bibasilar atelectasis noted, with mild chronic peribronchial thickening; lungs otherwise clear. 3.  Scattered vascular calcifications seen.   Original Report Authenticated By: Tonia Ghent, M.D.     Medications: Medications administered in the last 24 hours reviewed.  Current Medication List reviewed.    LOS: 1 day   [facility] Internal Medicine @ [facility] [phone]) 03/05/2013, 7:25 AM

## 2013-03-05 NOTE — Evaluation (Signed)
Physical Therapy Evaluation Patient Details Name: [name] Edwards MRN: [id number] DOB: 15-May-1916 Today's Date: 03/05/2013 Time: 4098-1191 PT Time Calculation (min): 39 min  PT Assessment / Plan / Recommendation Clinical Impression  79 y/o female with history of alzheimers adm for N/V, diarrhea and weakness. Presents to PT demonstrating generalized weakness and balance impairments in addition to her cognitive deficits making her a high fall risk. She will require 24 hour supervision/assistance on d/c. Family has indicated that she requires more assist than they are able to manage at this time. Rec. CSW consult to determine their options.     PT Assessment  Patient needs continued PT services    Follow Up Recommendations  SNF    Does the patient have the potential to tolerate intense rehabilitation      Barriers to Discharge Decreased caregiver support      Equipment Recommendations  None recommended by PT    Recommendations for Other Services     Frequency Min 2X/week    Precautions / Restrictions Precautions Precautions: Fall Restrictions Weight Bearing Restrictions: No   Pertinent Vitals/Pain Denies pain      Mobility  Bed Mobility Bed Mobility: Supine to Sit;Sitting - Scoot to Edge of Bed Supine to Sit: 5: Supervision;HOB elevated (30 degrees) Sitting - Scoot to Edge of Bed: 3: Mod assist Details for Bed Mobility Assistance: increased time to rise, cues for efficiency and assist to scoot with pad Transfers Transfers: Sit to Stand;Stand to Sit Sit to Stand: 4: Min guard;With upper extremity assist;From bed;From toilet Stand to Sit: 4: Min guard;With upper extremity assist;To toilet;To chair/3-in-1 Details for Transfer Assistance: cues for safe hand placement, gaurding for stability Ambulation/Gait Ambulation/Gait Assistance: 4: Min guard Ambulation Distance (Feet): 35 Feet Assistive device: Rolling walker Ambulation/Gait Assistance Details: cues for tall posture  and assist to negotiate RW in tight spaces and with turns to back up to chair/toilet Gait Pattern: Step-through pattern;Trunk flexed;Decreased stride length Gait velocity: decreased demonstrating increased risk for falls (admits to previous falls) Stairs: No    Exercises     PT Diagnosis: Difficulty walking;Abnormality of gait;Generalized weakness  PT Problem List: Decreased strength;Decreased activity tolerance;Decreased balance;Decreased mobility;Decreased cognition;Decreased safety awareness PT Treatment Interventions: DME instruction;Gait training;Functional mobility training;Therapeutic activities;Therapeutic exercise;Balance training;Neuromuscular re-education;Patient/family education   PT Goals Acute Rehab PT Goals PT Goal Formulation: With patient Time For Goal Achievement: 03/12/13 Potential to Achieve Goals: Good Pt will go Sit to Stand: with modified independence PT Goal: Sit to Stand - Progress: Goal set today Pt will go Stand to Sit: with modified independence PT Goal: Stand to Sit - Progress: Goal set today Pt will Transfer Bed to Chair/Chair to Bed: with modified independence PT Transfer Goal: Bed to Chair/Chair to Bed - Progress: Goal set today Pt will Ambulate: >150 feet;with modified independence;with least restrictive assistive device PT Goal: Ambulate - Progress: Goal set today  Visit Information  Last PT Received On: 03/05/13 Assistance Needed: +1    Subjective Data  Subjective: Oh, oh, oh... Patient Stated Goal: family wants long term placement for her   Prior Functioning  Home Living Lives With: Daughter Available Help at Discharge: Family;Available PRN/intermittently Type of Home: House Bathroom Shower/Tub: Tub/shower unit Home Adaptive Equipment: Walker - rolling Prior Function Level of Independence: Needs assistance Needs Assistance: Bathing;Dressing;Toileting;Meal Prep;Light Housekeeping;Gait;Transfers Bath: Minimal Dressing: Minimal Toileting:  Minimal Meal Prep: Total Light Housekeeping: Total Gait Assistance: min Transfer Assistance: min-mod Able to Take Stairs?: Yes (with assist) Vocation: Retired Musician: No difficulties  Cognition  Cognition Overall Cognitive Status: History of cognitive impairments - at baseline Area of Impairment: Attention;Memory Arousal/Alertness: Awake/alert Orientation Level: Disoriented to;Time Behavior During Session: Flat affect Current Attention Level: Sustained Cognition - Other Comments: baseline dementia/alzheimers, poor historian; modA for problem solving; macular degeneration    Extremity/Trunk Assessment Right Upper Extremity Assessment RUE ROM/Strength/Tone: WFL for tasks assessed Left Upper Extremity Assessment LUE ROM/Strength/Tone: WFL for tasks assessed Right Lower Extremity Assessment RLE ROM/Strength/Tone: WFL for tasks assessed Left Lower Extremity Assessment LLE ROM/Strength/Tone: WFL for tasks assessed   Balance Balance Balance Assessed: Yes Static Standing Balance Static Standing - Balance Support: No upper extremity supported Static Standing - Level of Assistance: 5: Stand by assistance Static Standing - Comment/# of Minutes: gaurding for performing hand washing at the sink unsupported  End of Session PT - End of Session Equipment Utilized During Treatment: Gait belt Activity Tolerance: Patient tolerated treatment well Patient left: in chair;with call bell/phone within reach Nurse Communication: Mobility status  GP Functional Limitation: Mobility: Walking and moving around Mobility: Walking and Moving Around Current Status [phone]): At least 1 percent but less than 20 percent impaired, limited or restricted Mobility: Walking and Moving Around Goal Status [phone]): 0 percent impaired, limited or restricted   [facility] [name] Edwards 03/05/2013, 3:14 PM

## 2013-03-06 LAB — URINE CULTURE

## 2013-03-06 LAB — GI PATHOGEN PANEL BY PCR, STOOL
C difficile toxin A/B: NEGATIVE
Campylobacter by PCR: NEGATIVE
Cryptosporidium by PCR: NEGATIVE
G lamblia by PCR: NEGATIVE
Norovirus GI/GII: POSITIVE
Rotavirus A by PCR: NEGATIVE

## 2013-03-06 LAB — BASIC METABOLIC PANEL
BUN: 27 mg/dL — ABNORMAL HIGH (ref 6–23)
CO2: 24 mEq/L (ref 19–32)
Calcium: 8.5 mg/dL (ref 8.4–10.5)
Chloride: 109 mEq/L (ref 96–112)
Creatinine, Ser: 1.04 mg/dL (ref 0.50–1.10)
GFR calc Af Amer: 52 mL/min — ABNORMAL LOW (ref >90)
GFR calc non Af Amer: 45 mL/min — ABNORMAL LOW (ref >90)
Glucose, Bld: 83 mg/dL (ref 70–99)
Potassium: 3.5 mEq/L (ref 3.5–5.1)
Sodium: 143 mEq/L (ref 135–145)

## 2013-03-06 NOTE — Progress Notes (Signed)
Assessment/Plan: Principal Problem:   Viral enteritis - seems to have flared back up. Stools for enteric pathogen pending.  Active Problems:   Dehydration - improving but still tenuous. She is on 20 cc/hr in addition to her intake. We will see if this will hold her or not. She may need resumption in IVF depending on whether diarrhea recurs. I will change to admission status as I do not think that she is stable for exit from the hospital at this time.    Chronic kidney disease - creatinine improving   HTN (hypertension) - controlled   Alzheimer's disease   Unspecified hypothyroidism   Weakness generalized   Subjective: Had four loose stools yesterday. Was able to eat but still quite weak. I note that PT thinks she is too weak for self-care at this point.   Objective:  Vital Signs: Filed Vitals:   03/05/13 0538 03/05/13 1421 03/05/13 2143 03/06/13 0640  BP: 140/119 140/70 146/94 131/75  Pulse: 90 72 97 82  Temp: 97.4 F (36.3 C) 97.6 F (36.4 C) 97.5 F (36.4 C) 97.5 F (36.4 C)  TempSrc: Oral Oral Axillary Axillary  Resp: 18 17 18 18   Height:      SpO2: 99% 99% 98% 98%     EXAM: alert, comfortable.    Intake/Output Summary (Last 24 hours) at 03/06/13 0742 Last data filed at 03/05/13 1824  Gross per 24 hour  Intake   1040 ml  Output      0 ml  Net   1040 ml    Lab Results:  Recent Labs  03/04/13 2305 03/05/13 0600 03/06/13 0615  NA 144 144 143  K 3.5 3.2* 3.5  CL 106 105 109  CO2 19 24 24   GLUCOSE 100* 90 83  BUN 38* 37* 27*  CREATININE 1.28* 1.27* 1.04  CALCIUM 9.3 9.0 8.5  MG 2.2  --   --     Recent Labs  03/04/13 2305  AST 39*  ALT 26  ALKPHOS 77  BILITOT 0.5  PROT 6.9  ALBUMIN 3.8   No results found for this basename: LIPASE, AMYLASE,  in the last 72 hours  Recent Labs  03/04/13 2305 03/05/13 0600  WBC 9.6 10.7*  HGB 14.6 13.4  HCT 42.0 39.9  MCV 92.1 92.4  PLT 235 238   No results found for this basename: CKTOTAL, CKMB, CKMBINDEX,  TROPONINI,  in the last 72 hours No components found with this basename: POCBNP,  No results found for this basename: DDIMER,  in the last 72 hours No results found for this basename: HGBA1C,  in the last 72 hours  Recent Labs  03/05/13 0600  CHOL 180  HDL 42  LDLCALC 115*  TRIG 117  CHOLHDL 4.3    Recent Labs  03/04/13 2305  TSH 4.662*   No results found for this basename: VITAMINB12, FOLATE, FERRITIN, TIBC, IRON, RETICCTPCT,  in the last 72 hours  Studies/Results: Acute Abdominal Series  03/04/2013  *RADIOLOGY REPORT*  Clinical Data: Nausea and vomiting.  ACUTE ABDOMEN SERIES (ABDOMEN 2 VIEW & CHEST 1 VIEW)  Comparison: Chest radiograph performed 04/14/2005  Findings: The lungs are well-aerated.  Minimal bibasilar atelectasis is noted.  Mild chronic peribronchial thickening is seen.  There is no evidence of pleural effusion or pneumothorax. The cardiomediastinal silhouette is within normal limits.  The visualized bowel gas pattern is unremarkable.  Scattered stool and air are seen within the colon; there is no evidence of small bowel dilatation to suggest obstruction.  No free intra-abdominal air is identified on the provided decubitus view.  Clips are noted within the right upper quadrant, reflecting prior cholecystectomy.  Degenerative change is noted at the lower lumbar spine; the sacroiliac joints are unremarkable in appearance.  Scattered vascular calcifications are seen.  IMPRESSION:  1.  Unremarkable bowel gas pattern; no free intra-abdominal air seen. 2.  Minimal bibasilar atelectasis noted, with mild chronic peribronchial thickening; lungs otherwise clear. 3.  Scattered vascular calcifications seen.   Original Report Authenticated By: Tonia Ghent, M.D.    Medications: Medications administered in the last 24 hours reviewed.  Current Medication List reviewed.    LOS: 2 days   [facility] Internal Medicine @ [facility] [phone]) 03/06/2013, 7:42 AM

## 2013-03-06 NOTE — Clinical Social Work Psychosocial (Signed)
     Clinical Social Work Department BRIEF PSYCHOSOCIAL ASSESSMENT 03/06/2013  Patient:  [name] Edwards, [name] Edwards     Account Number:  [id number]     Admit date:  03/04/2013  Clinical Social Worker:  Hulan Fray  Date/Time:  03/06/2013 10:39 AM  Referred by:  Physician  Date Referred:  03/05/2013 Referred for  SNF Placement   Other Referral:   Interview type:  Patient Other interview type:   Son-John Veracruz (C: [phone])    PSYCHOSOCIAL DATA Living Status:  FAMILY Admitted from facility:   Level of care:   Primary support name:  Olga Coaster Primary support relationship to patient:  CHILD, ADULT Degree of support available:   supportive    CURRENT CONCERNS Current Concerns  Post-Acute Placement   Other Concerns:    SOCIAL WORK ASSESSMENT / PLAN Clinical Social Worker received referral for SNF placement for patient. CSW introduced self to patient, with son at bedside and explained reason for visit. Patient was sitting in chair and very pleasant.    Per son, patient has been living with his sister, but has been become increasingly difficult to continue to care for patient. Son reported that family has been discussing placement for the patient and patient is agreeable. Son reported that he takes care of patient's finances and that she is "not doing medicaid." CSW explained the SNF process and the Medicare requirements for SNF placement. Patient voiced understanding and is aware that private pay may be a possibility for SNF placement. Son reported interest in [facility] as this facility is close for the majority of family to visit. Son reported interest in searching for SNF's that has a memory care unit and is agreeable for CSW to initiate search in [place]    CSW will complete FL2 for MD's signature and continue to follow and update patient and family when bed offers are made.   Assessment/plan status:  Psychosocial Support/Ongoing Assessment of Needs Other assessment/  plan:   Information/referral to community resources:   SNF packet    PATIENTS/FAMILYS RESPONSE TO PLAN OF CARE: Son and patient appear agreeable for SNF placement at discharge. Son reports that placement will eventually become long term. Son and patient were appreciative of CSW's visit and assistance with discharge planning.

## 2013-03-06 NOTE — Clinical Social Work Placement (Signed)
     Clinical Social Work Department CLINICAL SOCIAL WORK PLACEMENT NOTE 03/06/2013  Patient:  [name] Edwards, [name] Edwards  Account Number:  [id number] Admit date:  03/04/2013  Clinical Social Worker:  Rozetta Nunnery, Theresia Majors  Date/time:  03/06/2013 02:19 PM  Clinical Social Work is seeking post-discharge placement for this patient at the following level of care:   SKILLED NURSING   (*CSW will update this form in Epic as items are completed)   03/06/2013  Patient/family provided with [facility] Department of Clinical Social Works list of facilities offering this level of care within the geographic area requested by the patient (or if unable, by the patients family).  03/06/2013  Patient/family informed of their freedom to choose among providers that offer the needed level of care, that participate in Medicare, Medicaid or managed care program needed by the patient, have an available bed and are willing to accept the patient.  03/06/2013  Patient/family informed of [facility] ownership interest in [facility], as well as of the fact that they are under no obligation to receive care at this facility.  PASARR submitted to EDS on 03/06/2013 PASARR number received from EDS on 03/06/2013  FL2 transmitted to all facilities in geographic area requested by pt/family on  03/06/2013 FL2 transmitted to all facilities within larger geographic area on   Patient informed that his/her managed care company has contracts with or will negotiate with  certain facilities, including the following:     Patient/family informed of bed offers received:   Patient chooses bed at  Physician recommends and patient chooses bed at    Patient to be transferred to  on   Patient to be transferred to facility by   The following physician request were entered in Epic:   Additional Comments:

## 2013-03-07 NOTE — Progress Notes (Signed)
Received call from the lab for results from GI panel. Pt is Norovirus positive. Left sticky note for MD to address in AM.

## 2013-03-07 NOTE — Clinical Social Work Note (Signed)
Clinical Social Worker spoke with patient's son and reported that he is considering [facility] for short term rehab and then possibly transition to [facility]. CSW followed up with [facility] and they are considering patient for placement. CSW will continue to follow.   Rozetta Nunnery MSW, [employer] [phone]

## 2013-03-07 NOTE — Progress Notes (Signed)
Physical Therapy Treatment Patient Details Name: [name] Edwards MRN: [id number] DOB: [birth year] Today's Date: 03/07/2013 Time: 7829-5621 PT Time Calculation (min): 26 min  PT Assessment / Plan / Recommendation Comments on Treatment Session  pt pleasant and cooperative with PT Pt takes extra time for all mobility, but is able to do well with RW.  Recommend continued PT at SNF to increase functional independence    Follow Up Recommendations  SNF     Does the patient have the potential to tolerate intense rehabilitation     Barriers to Discharge        Equipment Recommendations  None recommended by PT    Recommendations for Other Services    Frequency Min 2X/week   Plan Discharge plan remains appropriate;Frequency remains appropriate    Precautions / Restrictions Precautions Precautions: Fall Restrictions Weight Bearing Restrictions: No   Pertinent Vitals/Pain No c/o pain    Mobility  Bed Mobility Bed Mobility: Supine to Sit;Sitting - Scoot to Edge of Bed Supine to Sit: 5: Supervision;HOB elevated (30 degrees) Sitting - Scoot to Edge of Bed: 4: Min assist Details for Bed Mobility Assistance: pt with some difficulty scooting on soft hospital mattress Transfers Transfers: Sit to Stand;Stand to Sit Sit to Stand: 4: Min guard;With upper extremity assist;From bed;From chair/3-in-1 Stand to Sit: 4: Min guard;With upper extremity assist;To chair/3-in-1 Details for Transfer Assistance: cues for safe hand placement, gaurding for stability Ambulation/Gait Ambulation/Gait Assistance: 4: Min guard Ambulation Distance (Feet): 50 Feet Assistive device: Rolling walker Ambulation/Gait Assistance Details: assist for turning and safety Gait Pattern: Step-through pattern;Trunk flexed;Decreased stride length Gait velocity: decreased demonstrating increased risk for falls (admits to previous falls) General Gait Details: pt appears comfortable with RW  but memory defecits apparent Stairs: No     Exercises Other Exercises Other Exercises: repeated sit to stand x 3 repetitions for strengthening   PT Diagnosis:    PT Problem List:   PT Treatment Interventions:     PT Goals Acute Rehab PT Goals PT Goal Formulation: With patient Time For Goal Achievement: 03/12/13 Potential to Achieve Goals: Good Pt will go Sit to Stand: with modified independence PT Goal: Sit to Stand - Progress: Progressing toward goal Pt will go Stand to Sit: with modified independence PT Goal: Stand to Sit - Progress: Progressing toward goal Pt will Transfer Bed to Chair/Chair to Bed: with modified independence PT Transfer Goal: Bed to Chair/Chair to Bed - Progress: Progressing toward goal Pt will Ambulate: >150 feet;with modified independence;with least restrictive assistive device PT Goal: Ambulate - Progress: Progressing toward goal  Visit Information  Last PT Received On: 03/07/13 Assistance Needed: +1    Subjective Data  Subjective: "thank you, thank you, thank you.  Please come back!" Patient Stated Goal: to get to bedside commode   Cognition  Cognition Arousal/Alertness: Awake/alert Behavior During Therapy: WFL for tasks assessed/performed (very pleasant, laughing and joking) Overall Cognitive Status: Within Functional Limits for tasks assessed Area of Impairment: Memory Orientation Level: Disoriented to;Time Current Attention Level: Sustained General Comments: baseline dementia/alzheimers, poor historian    Balance     End of Session PT - End of Session Equipment Utilized During Treatment: Gait belt Activity Tolerance: Patient tolerated treatment well Patient left: in chair;with call bell/phone within reach Nurse Communication: Mobility status   GP    Rosey Bath K. [place], [place] [phone] 03/07/2013, 2:39 PM

## 2013-03-07 NOTE — Progress Notes (Signed)
Assessment/Plan: Principal Problem:   Gastroenteritis due to norovirus - improving.  Active Problems:   Dehydration - resolved.    Chronic kidney disease - creatinine is better   HTN (hypertension)   Alzheimer's disease   Unspecified hypothyroidism   Weakness generalized   Subjective: Feels better. No further diarrhea or nausea and vomiting.   Objective:  Vital Signs: Filed Vitals:   03/06/13 0640 03/06/13 1356 03/06/13 2127 03/07/13 0451  BP: 131/75 146/72 143/75 138/75  Pulse: 82 82 81 80  Temp: 97.5 F (36.4 C) 97.7 F (36.5 C) 97.9 F (36.6 C) 98 F (36.7 C)  TempSrc: Axillary Axillary Axillary Oral  Resp: 18 20 20 18   Height:      SpO2: 98% 100% 100% 100%     EXAM: alert, comfortable.    Intake/Output Summary (Last 24 hours) at 03/07/13 0712 Last data filed at 03/07/13 0617  Gross per 24 hour  Intake    312 ml  Output   1075 ml  Net   -763 ml    Lab Results:  Recent Labs  03/04/13 2305 03/05/13 0600 03/06/13 0615  NA 144 144 143  K 3.5 3.2* 3.5  CL 106 105 109  CO2 19 24 24   GLUCOSE 100* 90 83  BUN 38* 37* 27*  CREATININE 1.28* 1.27* 1.04  CALCIUM 9.3 9.0 8.5  MG 2.2  --   --     Recent Labs  03/04/13 2305  AST 39*  ALT 26  ALKPHOS 77  BILITOT 0.5  PROT 6.9  ALBUMIN 3.8   No results found for this basename: LIPASE, AMYLASE,  in the last 72 hours  Recent Labs  03/04/13 2305 03/05/13 0600  WBC 9.6 10.7*  HGB 14.6 13.4  HCT 42.0 39.9  MCV 92.1 92.4  PLT 235 238   No results found for this basename: CKTOTAL, CKMB, CKMBINDEX, TROPONINI,  in the last 72 hours No components found with this basename: POCBNP,  No results found for this basename: DDIMER,  in the last 72 hours No results found for this basename: HGBA1C,  in the last 72 hours  Recent Labs  03/05/13 0600  CHOL 180  HDL 42  LDLCALC 115*  TRIG 117  CHOLHDL 4.3    Recent Labs  03/04/13 2305  TSH 4.662*   No results found for this basename: VITAMINB12,  FOLATE, FERRITIN, TIBC, IRON, RETICCTPCT,  in the last 72 hours  Studies/Results: No results found. Medications: Medications administered in the last 24 hours reviewed.  Current Medication List reviewed.    LOS: 3 days   [facility] Internal Medicine @ [facility] [phone]) 03/07/2013, 7:12 AM

## 2013-03-08 NOTE — Progress Notes (Signed)
Discharged to [facility] by PTAR. [name] Edwards 03/08/2013

## 2013-03-08 NOTE — Progress Notes (Signed)
Report called to [facility].  Chiropodist of [facility] took report on pt. All questions answered. LCSW will let staff know when transportation has been called to continue pt's discharge.

## 2013-03-08 NOTE — Discharge Summary (Signed)
Physician Discharge Summary  Patient ID: [name] Edwards MRN: [id number] DOB/AGE: 1916-03-20 79 y.o.  Admit date: 03/04/2013 Discharge date: 03/08/2013  Admission Diagnoses: Acute gastroenteritis Dehydration Acute on chronic kidney disease Hypertension Alzheimer's dementia Hypothyroidism  Discharge Diagnoses:  Principal Problem:   Gastroenteritis due to norovirus Active Problems:   Dehydration   Chronic kidney disease   HTN (hypertension)   Alzheimer's disease   Unspecified hypothyroidism   Weakness generalized   Discharged Condition: good  Hospital Course: The patient was admitted after presenting to the office of her primary care physician with nausea vomiting diarrhea and a rise in creatinine from baseline of about 1.1-1.6. She was not eating or drinking and generally weak. She was admitted and placed on IV fluids. GI pathogen stool panel showed normal thyroid. Her diarrhea resolved by April 16. She was consuming a diet well at discharge. She was seen by physical therapy and nursing home placement for rehabilitation was recommended. At discharge BUN and creatinine were 27 /1.04.  Consults: None  Significant Diagnostic Studies: labs: BUN and creatinine 27 1.04, electrolytes normal and radiology: KUB: Nonspecific bowel gas pattern  Treatments: IV hydration  Discharge Exam: Blood pressure 137/83, pulse 89, temperature 97.6 F (36.4 C), temperature source Oral, resp. rate 18, height 5\' 2"  (1.575 m), SpO2 100.00%. General appearance: alert Resp: clear to auscultation bilaterally Cardio: regular rate and rhythm, S1, S2 normal, no murmur, click, rub or gallop GI: soft, non-tender; bowel sounds normal; no masses,  no organomegaly  Disposition:      Medication List    STOP taking these medications       naproxen sodium 220 MG tablet  Commonly known as:  ANAPROX      TAKE these medications       aspirin 81 MG chewable tablet  Chew 81 mg by mouth daily.     calcium  carbonate 500 MG chewable tablet  Commonly known as:  TUMS - dosed in mg elemental calcium  Chew 1 tablet by mouth daily.     EYE VITAMINS PO  Take 1 tablet by mouth daily.     fish oil-omega-3 fatty acids 1000 MG capsule  Take 1 g by mouth 2 (two) times daily.     hydrochlorothiazide 25 MG tablet  Commonly known as:  HYDRODIURIL  Take 25 mg by mouth daily.     levothyroxine 75 MCG tablet  Commonly known as:  SYNTHROID, LEVOTHROID  Take 75 mcg by mouth daily before breakfast.     memantine 5 MG tablet  Commonly known as:  NAMENDA  Take 5 mg by mouth 2 (two) times daily.     multivitamin with minerals Tabs  Take 1 tablet by mouth daily.     pregabalin 50 MG capsule  Commonly known as:  LYRICA  Take 50 mg by mouth daily.     rivastigmine 4.6 mg/24hr  Commonly known as:  EXELON  Place 1 patch onto the skin daily.     vitamin C 100 MG tablet  Take 100 mg by mouth daily.           Follow-up Information   Follow up with Darnelle Bos, MD In 4 weeks.   Contact information:   [address], [facility] AND [address] [place] [address] [phone]       Signed: Lillia Mountain 03/08/2013, 10:16 AM

## 2013-03-08 NOTE — Clinical Social Work Note (Addendum)
Clinical Social Worker received call from patient's son and he reported that his sister plans to be at the hospital today. Son reported that he confirmed [facility] and CSW called and left a message with facility. CSW will continue to follow.   10:54am CSW spoke with son regarding discharge today. CSW and CM spoke with son regarding the possibility of having to private pay at the facility given the Medicare guidelines regarding payment for SNF stay. Son voiced understanding and requested for CSW to have facility give him a call regarding the private payment. CSW left a message with facility regarding this information and is awaiting a return call back. Son appeared still interested in transferring patient to facility today.   13:13pm CSW spoke with son and he reported he spoke with facility and will have patient transported via ambulance to [facility]. CSW will complete discharge packet. CSW will sign off, as social work intervention is no longer needed.  Rozetta Nunnery MSW, [employer] [phone]

## 2013-03-08 NOTE — Progress Notes (Signed)
Subjective: No complaints  Objective: Vital signs in last 24 hours: Temp:  [97.5 F (36.4 C)-98 F (36.7 C)] 97.6 F (36.4 C) (04/18 0620) Pulse Rate:  [77-89] 89 (04/18 0620) Resp:  [18] 18 (04/18 0620) BP: (124-150)/(64-89) 137/83 mmHg (04/18 0620) SpO2:  [100 %] 100 % (04/18 0620) Weight change:  Last BM Date: 03/06/13  Intake/Output from previous day:   Intake/Output this shift:    General appearance: alert and cooperative Resp: clear to auscultation bilaterally Cardio: regular rate and rhythm, S1, S2 normal, no murmur, click, rub or gallop GI: soft, non-tender; bowel sounds normal; no masses,  no organomegaly  Lab Results: No results found for this basename: WBC, HGB, HCT, PLT,  in the last 72 hours BMET  Recent Labs  03/06/13 0615  NA 143  K 3.5  CL 109  CO2 24  GLUCOSE 83  BUN 27*  CREATININE 1.04  CALCIUM 8.5    Studies/Results: No results found.  Medications: I have reviewed the patient's current medications.  Assessment/Plan: Principal Problem:  Gastroenteritis due to norovirus -improved  Active Problems:  Dehydration - resolved.  Chronic kidney disease - creatinine is better  HTN (hypertension)  Alzheimer's disease  Unspecified hypothyroidism  Weakness generalized NHP  LOS: 4 days   Joie Reamer JOSEPH 03/08/2013, 10:08 AM

## 2013-03-08 NOTE — Care Management Note (Signed)
  Page 1 of 1   03/08/2013     11:50:52 AM   CARE MANAGEMENT NOTE 03/08/2013  Patient:  [name] Edwards, [name] Edwards   Account Number:  [id number]  Date Initiated:  03/08/2013  Documentation initiated by:  Ronny Flurry  Subjective/Objective Assessment:     Action/Plan:   Anticipated DC Date:     Anticipated DC Plan:           Choice offered to / List presented to:             Status of service:   Medicare Important Message given?   (If response is "NO", the following Medicare IM given date fields will be blank) Date Medicare IM given:   Date Additional Medicare IM given:    Discharge Disposition:    Per UR Regulation:    If discussed at Long Length of Stay Meetings, dates discussed:    Comments:  03-08-13 MEDMGT determination as of 03-06-13 inpatient D ordered observation on 03/04/2013  at  6:31 PM MD changed order to inpatient on 03/06/2013  at  7:45 AM  Patient for SNF placement . Explained to patient's son patient does not have 3 day inpatient stay , therefore , medicare will not pay for SNF rehab. Mr Quintin voiced understanding .  Ronny Flurry RN BSN [phone]

## 2013-12-04 ENCOUNTER — Telehealth: Payer: Self-pay

## 2013-12-04 NOTE — Telephone Encounter (Signed)
Patient past away per Obituary in [place] [employer] °

## 2013-12-22 DEATH — deceased

## 2014-10-10 IMAGING — CR DG ABDOMEN ACUTE W/ 1V CHEST
4 series · 4 of 4 positions shown · non-contrast
Comparison: Chest radiograph performed 04/14/2005

CLINICAL DATA: Nausea and vomiting.

ACUTE ABDOMEN SERIES (ABDOMEN 2 VIEW & CHEST 1 VIEW)

[t abdomen supine]
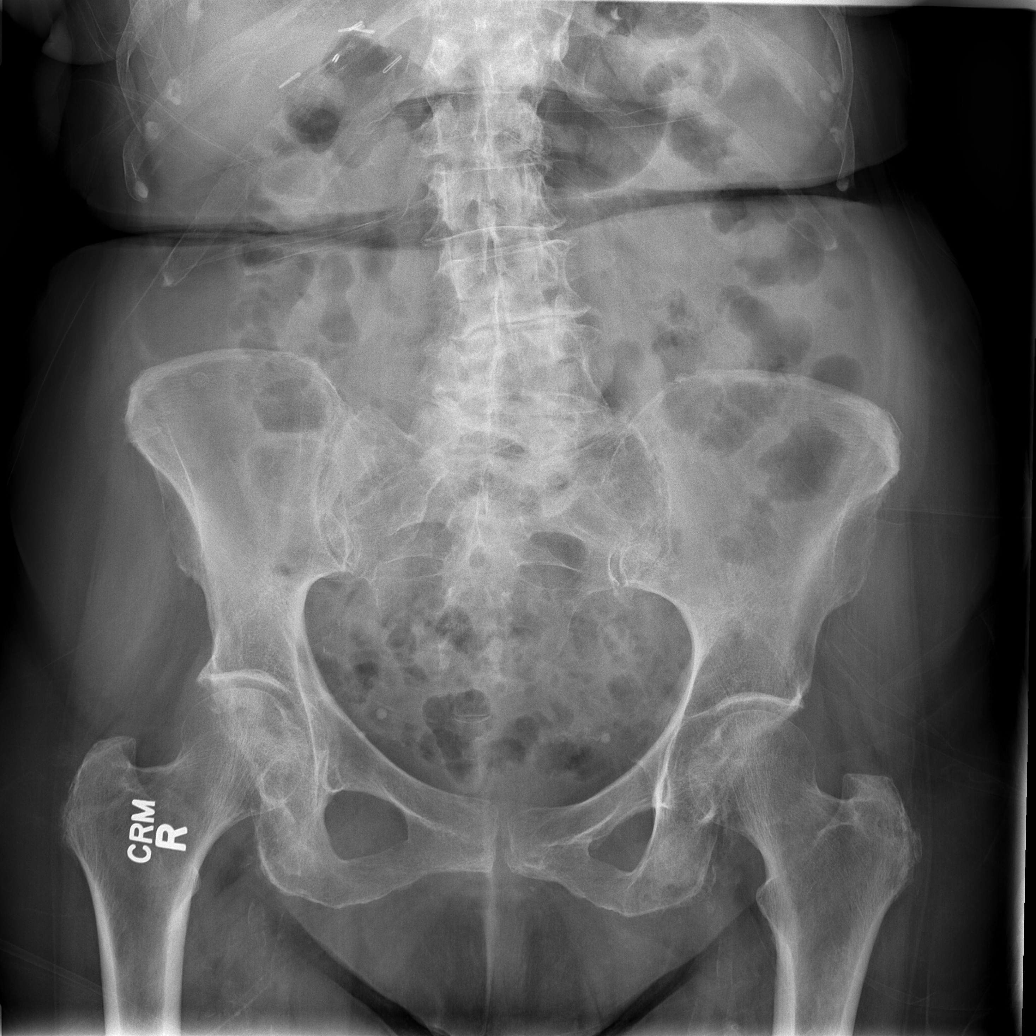

[t chest supine]
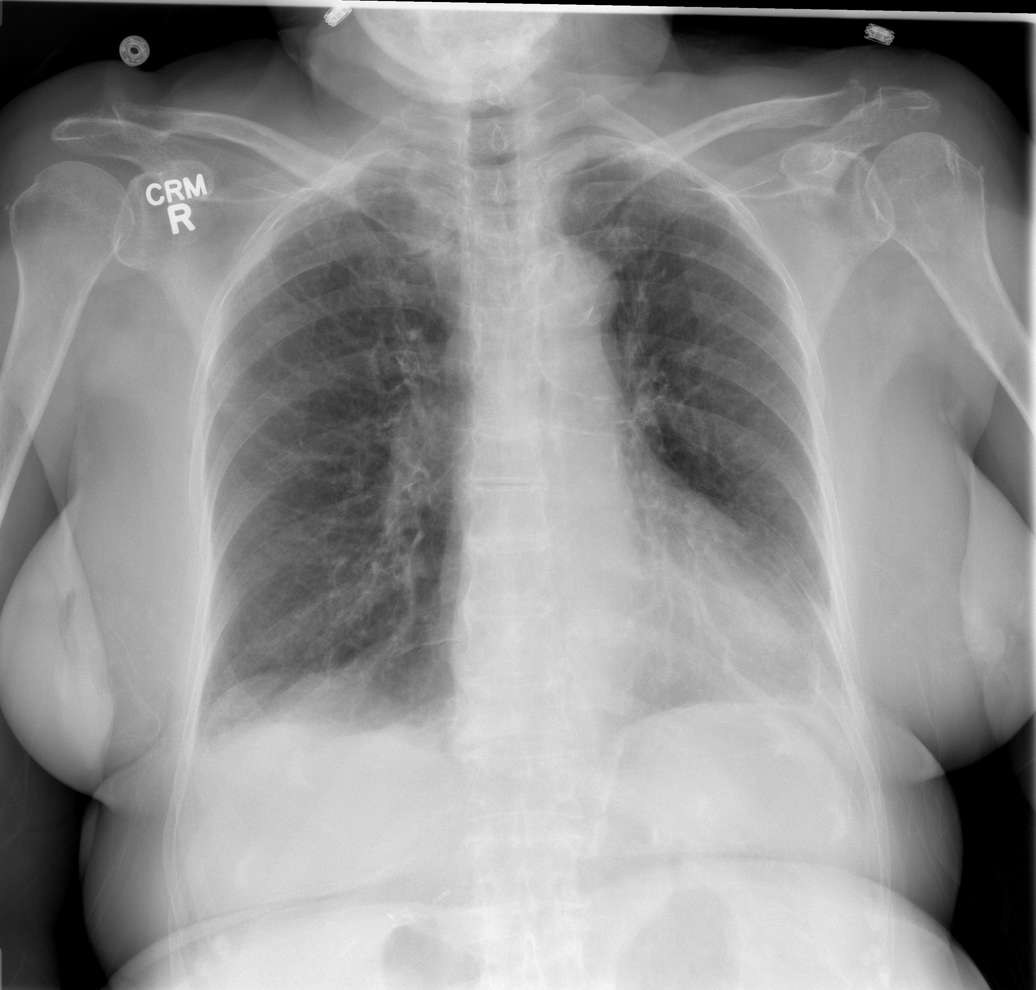

[w abdomen upright (1 of 2)]
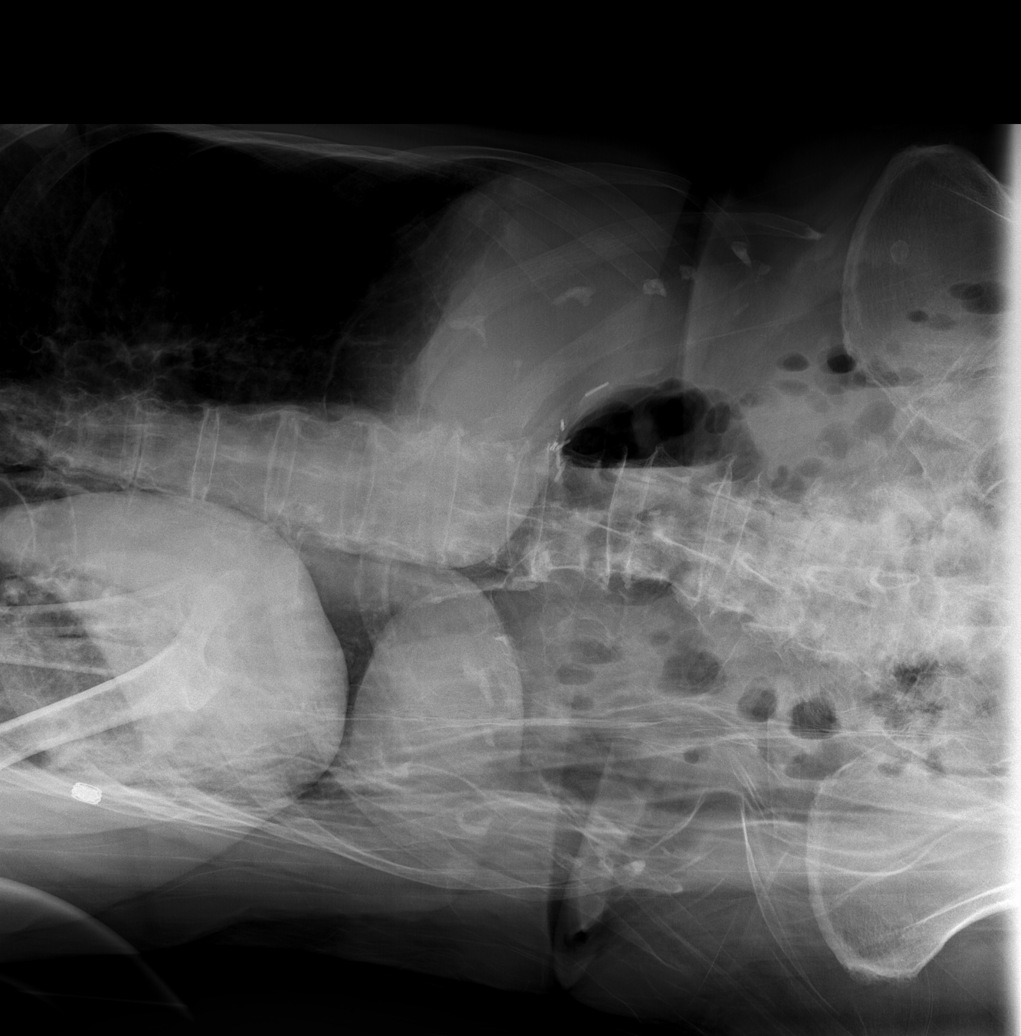

[w abdomen upright (2 of 2)]
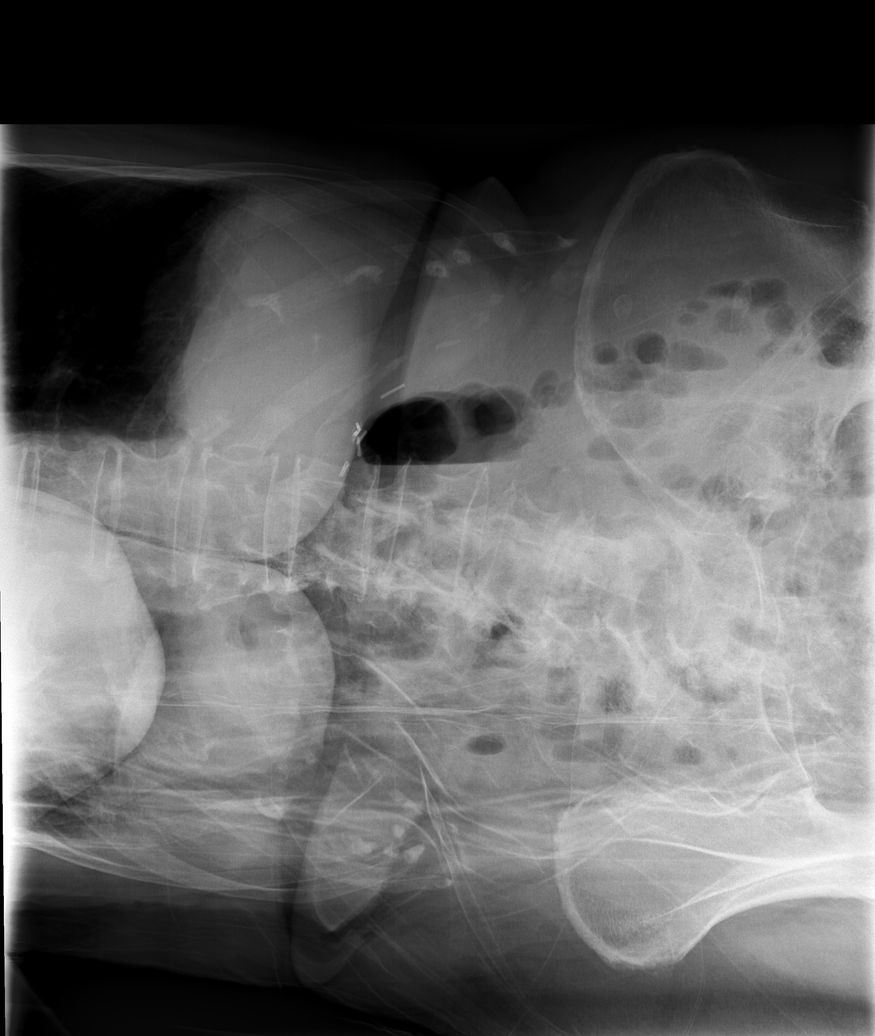

[4 of 4 positions shown; findings below may reference images not displayed]

FINDINGS: The lungs are well-aerated.  Minimal bibasilar
atelectasis is noted.  Mild chronic peribronchial thickening is
seen.  There is no evidence of pleural effusion or pneumothorax.
The cardiomediastinal silhouette is within normal limits.

The visualized bowel gas pattern is unremarkable.  Scattered stool
and air are seen within the colon; there is no evidence of small
bowel dilatation to suggest obstruction.  No free intra-abdominal
air is identified on the provided decubitus view.  Clips are noted
within the right upper quadrant, reflecting prior cholecystectomy.

Degenerative change is noted at the lower lumbar spine; the
sacroiliac joints are unremarkable in appearance.  Scattered
vascular calcifications are seen.
IMPRESSION: 1.  Unremarkable bowel gas pattern; no free intra-abdominal air
seen.
2.  Minimal bibasilar atelectasis noted, with mild chronic
peribronchial thickening; lungs otherwise clear.
3.  Scattered vascular calcifications seen.
